# Patient Record
Sex: Female | Born: 2000 | Race: Black or African American | Hispanic: No | Marital: Single | Smoking: Never smoker
Health system: Southern US, Community
[De-identification: ages and names within clinical notes are randomized; demographics above are authoritative.]

---
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
## 2020-01-24 ENCOUNTER — Telehealth: Payer: Self-pay

## 2020-01-24 NOTE — Telephone Encounter (Signed)

## 2020-01-25 ENCOUNTER — Other Ambulatory Visit: Payer: Self-pay

## 2020-01-25 ENCOUNTER — Ambulatory Visit (INDEPENDENT_AMBULATORY_CARE_PROVIDER_SITE_OTHER): Payer: Medicaid Other | Admitting: Pediatrics

## 2020-01-25 VITALS — BP 100/74 | Wt 110.6 lb

## 2020-01-25 DIAGNOSIS — Z130 Encounter for screening for diseases of the blood and blood-forming organs and certain disorders involving the immune mechanism: Secondary | ICD-10-CM

## 2020-01-25 DIAGNOSIS — L700 Acne vulgaris: Secondary | ICD-10-CM

## 2020-01-25 DIAGNOSIS — D508 Other iron deficiency anemias: Secondary | ICD-10-CM | POA: Diagnosis not present

## 2020-01-25 LAB — POCT HEMOGLOBIN: Hemoglobin: 7.2 g/dL — AB (ref 11–14.6)

## 2020-01-25 MED ORDER — RETIN-A 0.025 % EX CREA: TOPICAL_CREAM | CUTANEOUS | 1 refills | Status: DC

## 2020-01-25 NOTE — Patient Instructions (Addendum)
Your labs should be back tomorrow I will call you and set up further care with you.  Continue healthy eating   Try for 5 fruits and vegetables daily  Limit milk or milk products to 1 or 2 servings a day  Eat meats like lean beef, dark meat chicken (think legs and thighs)  6 to 8 glasses of water to drink  Avoid soda  For Acne: Use a cleanser like Dove for Sensitive Skin to wash your face and pat dry. Apply the prescription medicine only at night. Use an SPF 30 cream during the day - Cetaphil makes a good one, Oil of Olay makes a good

## 2020-01-25 NOTE — Progress Notes (Signed)
   Subjective:    Patient ID: Barbara Franklin, female    DOB: 05-22-2001, 19 y.o.   MRN: [id number]  HPI [name] is here to check on her anemia.  She was diagnosed with anemia in 2007 and started on iron.  She states she had stopped her iron supplement and is now having headaches.  Would like her hemoglobin checked.  Eats meats and green vegetables, especially spinach, broccoli, cabbage. Menses are once a month and last about 5 days, with lighter flow towards end of the period.  Having headaches; no dizziness or sense of weakness. Vision is okay. No vomiting or diarrhea. 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 Also states she now has itching. No change in stool habits and no abdominal pain.  2nd concern is acne.  Would like medication for this.  Used benzaclin in the past.  Personal number:  [phone]  Review of Systems As noted in HPI.    Objective:   Physical Exam Vitals and nursing note reviewed.  Constitutional:      General: She is not in acute distress.    Appearance: Normal appearance. She is normal weight.  HENT:     Nose: Nose normal.     Mouth/Throat:     Mouth: Mucous membranes are moist.     Pharynx: Oropharynx is clear.  Cardiovascular:     Rate and Rhythm: Normal rate and regular rhythm.     Pulses: Normal pulses.     Heart sounds: Normal heart sounds. No murmur.  Pulmonary:     Effort: Pulmonary effort is normal. No respiratory distress.     Breath sounds: Normal breath sounds.  Abdominal:     General: Abdomen is flat.     Palpations: Abdomen is soft. There is no mass.     Tenderness: There is no abdominal tenderness.  Musculoskeletal:     Cervical back: Normal range of motion and neck supple.  Skin:    Capillary Refill: Capillary refill takes less than 2 seconds.     Comments: Multiple open and closed comedones at forehead and both cheeks  Neurological:     Mental Status: She is alert.  Psychiatric:        Mood and Affect: Mood normal.        Behavior: Behavior normal.       Assessment & Plan:   1. Screening for iron deficiency anemia   2. Iron deficiency anemia secondary to inadequate dietary iron intake   3. Acne vulgaris   Discussed labs ordered and will follow up with her on the results and plan tomorrow. Discussed acne care - mild cleanser, retinoid, SPF.  Discussed will get better and likely recur so care needs to be continuous based on symptoms.  She voiced understanding. Meds ordered this encounter  Medications  . RETIN-A 0.025 % cream    Sig: Apply to areas of acne at bedtime after gently cleaning face.  Use SPF 30 on face during the day.    Dispense:  45 g    Refill:  1   Maree Erie, MD

## 2020-01-26 ENCOUNTER — Encounter: Payer: Self-pay | Admitting: Pediatrics

## 2020-01-26 LAB — CBC
HCT: 40.8 % (ref 35.0–45.0)
Hemoglobin: 14.1 g/dL (ref 11.7–15.5)
MCH: 29.1 pg (ref 27.0–33.0)
MCHC: 34.6 g/dL (ref 32.0–36.0)
MCV: 84.1 fL (ref 80.0–100.0)
MPV: 8.7 fL (ref 7.5–12.5)
Platelets: 322 10*3/uL (ref 140–400)
RBC: 4.85 10*6/uL (ref 3.80–5.10)
RDW: 12.4 % (ref 11.0–15.0)
WBC: 7 10*3/uL (ref 3.8–10.8)

## 2020-01-26 LAB — RETICULOCYTES
ABS Retic: 48500 cells/uL (ref 20000–8000)
Retic Ct Pct: 1 %

## 2020-01-26 LAB — FERRITIN: Ferritin: 2 ng/mL — ABNORMAL LOW (ref 16–154)

## 2020-01-26 LAB — IRON,?TOTAL/TOTAL IRON BINDING CAP
Iron: 11 ug/dL — ABNORMAL LOW (ref 27–164)
TIBC: 551 mcg/dL (calc) — ABNORMAL HIGH (ref 271–448)

## 2020-01-26 LAB — IRON, TOTAL/TOTAL IRON BINDING CAP: %SAT: 2 % (calc) — ABNORMAL LOW (ref 15–45)

## 2020-03-01 ENCOUNTER — Ambulatory Visit (INDEPENDENT_AMBULATORY_CARE_PROVIDER_SITE_OTHER): Payer: Medicaid Other | Admitting: Pediatrics

## 2020-03-01 ENCOUNTER — Encounter: Payer: Self-pay | Admitting: Pediatrics

## 2020-03-01 ENCOUNTER — Other Ambulatory Visit: Payer: Self-pay

## 2020-03-01 VITALS — BP 110/78 | Wt 108.8 lb

## 2020-03-01 DIAGNOSIS — D508 Other iron deficiency anemias: Secondary | ICD-10-CM

## 2020-03-01 DIAGNOSIS — Z7189 Other specified counseling: Secondary | ICD-10-CM | POA: Diagnosis not present

## 2020-03-01 DIAGNOSIS — L700 Acne vulgaris: Secondary | ICD-10-CM

## 2020-03-01 DIAGNOSIS — Z7187 Encounter for pediatric-to-adult transition counseling: Secondary | ICD-10-CM

## 2020-03-01 LAB — POCT HEMOGLOBIN: Hemoglobin: 10.2 g/dL — AB (ref 11–14.6)

## 2020-03-01 NOTE — Patient Instructions (Signed)
Please do the stool slides on Sunday or Monday and bring to the office on Monday.  I will let you know about your blood work on Monday.  Continue to clean your face with [facility] and use the Retin-A at bedtime; SPF moisturizer during the day. You will get a call from the dermatology office about setting an appointment

## 2020-03-01 NOTE — Progress Notes (Signed)
   Subjective:    Patient ID: Barbara Franklin, female    DOB: 2001/10/15, 19 y.o.   MRN: [id number]  HPI Barbara Franklin is here for follow up on anemia and acne.  She states she is doing well and has been taking her iron tablets at bedtime.  1.  Anemia:   States since she started back on the iron she sometimes has nausea when she eats and feels like she is going to throw up. States stools are dark on the iron. Neither was a problem before taking the medication.  2.  Acne: States she is using her acne medication but notices whenever one pimple goes away, another one pops up. Using [employer] and SPF.  Works [employer] Fri 2 to 11 pm Personal phone:  [phone]  PMH, problem list, medications and allergies, family and social history reviewed and updated as indicated.  Review of Systems  Constitutional: Negative for activity change, appetite change, fatigue and fever.  Cardiovascular: Negative for chest pain.  Gastrointestinal: Positive for nausea. Negative for abdominal pain and anal bleeding.  Genitourinary: Negative for menstrual problem.       Objective:   Physical Exam Vitals and nursing note reviewed.  Constitutional:      General: She is not in acute distress.    Appearance: She is normal weight.  Cardiovascular:     Rate and Rhythm: Normal rate and regular rhythm.     Pulses: Normal pulses.     Heart sounds: No murmur.  Pulmonary:     Effort: Pulmonary effort is normal. No respiratory distress.     Breath sounds: Normal breath sounds.  Skin:    General: Skin is warm and dry.     Comments: Open and closed comedones present and hyperpigmented scars  Neurological:     Mental Status: She is alert.       Assessment & Plan:  1. Iron deficiency anemia secondary to inadequate dietary iron intake Advised that POC hemoglobin is improved; will check venipuncture due to wide disagreement in values at last visit. Dark stools and nausea may be due to the supplement or occult blood  loss. Advised continuing iron tablets for now.  Healthy diet. Hemoccult cards given and she is asked to bring those back on Monday 5/17 - POCT hemoglobin - Iron,Total/Total Iron Binding Cap - Ferritin - Reticulocytes - CBC  2. Acne vulgaris Advised that her experience is typical and she should continue with her current regimen. Referral placed to Dermatology due to marked hyperpigmentation and scarring associated with her acne. - Ambulatory referral to Dermatology  3. Counseling for transition from pediatric to adult care provider Discussed transition to adult care.  Barbara Franklin presents with awareness of her health needs, medications and insurance. She is due for an annual visit. She is agreeable to transfer to [facility]. - Ambulatory referral to [facility]  Maree Erie, MD

## 2020-03-02 LAB — RETICULOCYTES
ABS Retic: 56980 cells/uL (ref 20000–8000)
Retic Ct Pct: 1.1 %

## 2020-03-02 LAB — CBC
HCT: 39.6 % (ref 35.0–45.0)
Hemoglobin: 11.8 g/dL (ref 11.7–15.5)
MCH: 22.8 pg — ABNORMAL LOW (ref 27.0–33.0)
MCHC: 29.8 g/dL — ABNORMAL LOW (ref 32.0–36.0)
MCV: 76.4 fL — ABNORMAL LOW (ref 80.0–100.0)
MPV: 9.6 fL (ref 7.5–12.5)
Platelets: 534 10*3/uL — ABNORMAL HIGH (ref 140–400)
RBC: 5.18 10*6/uL — ABNORMAL HIGH (ref 3.80–5.10)
WBC: 5.7 10*3/uL (ref 3.8–10.8)

## 2020-03-02 LAB — FERRITIN: Ferritin: 22 ng/mL (ref 16–154)

## 2020-03-02 LAB — IRON, TOTAL/TOTAL IRON BINDING CAP
%SAT: 25 % (calc) (ref 15–45)
Iron: 118 ug/dL (ref 27–164)
TIBC: 471 mcg/dL (calc) — ABNORMAL HIGH (ref 271–448)

## 2020-03-11 NOTE — Progress Notes (Signed)
MyChart message viewed by patient on 03/08/2020. Comments to Patient  "Seen by patient [name] on 03/08/2020 5:54 PM EDT   Hello. Barbara Franklin. The iron treatment by mouth is working well but you need to continue to take it for at least one more month. Please call the office to schedule a repeat blood test in late June. Also, don't forget to bring the stool cards. Written by Maree Erie, MD on 03/08/2020 5:54 PM EDT"

## 2020-04-29 ENCOUNTER — Encounter: Payer: Self-pay | Admitting: Pediatrics

## 2020-04-29 ENCOUNTER — Other Ambulatory Visit: Payer: Self-pay

## 2020-04-29 ENCOUNTER — Ambulatory Visit (INDEPENDENT_AMBULATORY_CARE_PROVIDER_SITE_OTHER): Payer: Medicaid Other | Admitting: Pediatrics

## 2020-04-29 DIAGNOSIS — Z7187 Encounter for pediatric-to-adult transition counseling: Secondary | ICD-10-CM

## 2020-04-29 DIAGNOSIS — Z23 Encounter for immunization: Secondary | ICD-10-CM

## 2020-04-29 DIAGNOSIS — L700 Acne vulgaris: Secondary | ICD-10-CM | POA: Diagnosis not present

## 2020-04-29 DIAGNOSIS — Z7189 Other specified counseling: Secondary | ICD-10-CM

## 2020-04-29 DIAGNOSIS — D508 Other iron deficiency anemias: Secondary | ICD-10-CM

## 2020-04-29 LAB — POCT HEMOGLOBIN: Hemoglobin: 12.4 g/dL (ref 11–14.6)

## 2020-04-29 MED ORDER — RETIN-A 0.025 % EX CREA: TOPICAL_CREAM | CUTANEOUS | 1 refills | Status: DC

## 2020-04-29 MED ORDER — ONE-A-DAY WOMENS PETITES PO TABS: ORAL_TABLET | ORAL | 5 refills | Status: DC

## 2020-04-29 NOTE — Progress Notes (Signed)
   Subjective:    Patient ID: Barbara Franklin, female    DOB: July 10, 2001, 19 y.o.   MRN: [id number]  HPI Barbara Franklin is here for follow up on anemia after treatment with supplemental iron. She states compliance and reports she is feeling much better. Asks if she needs to stay on the iron supplement.  Also asks for refill of her acne medication.  She is currently working at [employer]; has not taken COVID vaccine.  PMH, problem list, medications and allergies, family and social history reviewed and updated as indicated.  Review of Systems As noted in HPI above.    Objective:   Physical Exam Vitals and nursing note reviewed.  Constitutional:      General: She is not in acute distress.    Appearance: Normal appearance. She is normal weight.  HENT:     Head: Normocephalic.  Eyes:     General: No scleral icterus. Cardiovascular:     Rate and Rhythm: Normal rate and regular rhythm.     Pulses: Normal pulses.     Heart sounds: Normal heart sounds. No murmur heard.   Pulmonary:     Effort: Pulmonary effort is normal. No respiratory distress.     Breath sounds: Normal breath sounds.  Skin:    General: Skin is warm and dry.     Comments: Few scattered open and closed comedones at forehead and cheeks  Neurological:     Mental Status: She is alert.    Results for orders placed or performed in visit on 04/29/20 (from the past 72 hour(s))  POCT hemoglobin     Status: Normal   Collection Time: 04/29/20  3:56 PM  Result Value Ref Range   Hemoglobin 12.4 11 - 14.6 g/dL      Assessment & Plan:   1. Iron deficiency anemia secondary to inadequate dietary iron intake Informed Barbara Franklin that value is good today.  Advised her to stop the iron sulfate tablets and start a daily multivitamin with iron.   Recommended petites due to her report of trouble swallowing standard tablet. - POCT hemoglobin - Multiple Vitamins-Minerals (ONE-A-DAY WOMENS PETITES) TABS; Take 2 tablets by mouth once a day  every day as a nutritional supplement  Dispense: 160 tablet; Refill: 5  2. Acne vulgaris Skin is improved and she has a healthy glow today.  Advised continued use of mild cleanser and SPF during the day. Refill entered. - RETIN-A 0.025 % cream; Apply to areas of acne at bedtime after gently cleaning face.  Use SPF 30 on face during the day.  Dispense: 45 g; Refill: 1  3. Counseling for transition from pediatric to adult care provider Counseled on transition to adult care.  She shows knowledge of her health needs, her insurance and medications. She agreed to referral to [facility].  Referral placed and advised patient to call for complete physical. - Ambulatory referral to [facility]  4. Need for vaccination Counseled Barbara Franklin on COVID vaccine including vaccine type available here, number of doses, desired effect and potential SE. She voiced understanding but declined vaccine for today, stating desire to speak with her parents.  Printed information provided for further education.  Number given to call and schedule vaccine if she decides to move forth.  Barbara Erie, MD

## 2020-04-29 NOTE — Patient Instructions (Addendum)
You have been referred to [facility] for continued health care as a young adult. You can call them to schedule a wellness physical and to get established in care.  They will accept your insurance.  Address: [address], [place], [place] [address] Phone: [phone]  I have sent your prescriptions to the pharmacy.  If your insurance does not cover the vitamins, I have asked the pharmacist to show you over the counter options.  Please consider the COVID vaccine for your wellness.  You can call and schedule:  [phone]

## 2020-08-20 ENCOUNTER — Other Ambulatory Visit: Payer: Self-pay

## 2020-08-20 ENCOUNTER — Ambulatory Visit (INDEPENDENT_AMBULATORY_CARE_PROVIDER_SITE_OTHER): Payer: Medicaid Other | Admitting: Family Medicine

## 2020-08-20 ENCOUNTER — Encounter: Payer: Self-pay | Admitting: Family Medicine

## 2020-08-20 VITALS — BP 108/72 | HR 80 | Ht 60.25 in | Wt 111.8 lb

## 2020-08-20 DIAGNOSIS — L2082 Flexural eczema: Secondary | ICD-10-CM

## 2020-08-20 DIAGNOSIS — Z23 Encounter for immunization: Secondary | ICD-10-CM | POA: Diagnosis not present

## 2020-08-20 DIAGNOSIS — R21 Rash and other nonspecific skin eruption: Secondary | ICD-10-CM

## 2020-08-20 DIAGNOSIS — D508 Other iron deficiency anemias: Secondary | ICD-10-CM

## 2020-08-20 DIAGNOSIS — L700 Acne vulgaris: Secondary | ICD-10-CM | POA: Diagnosis not present

## 2020-08-20 MED ORDER — TRIAMCINOLONE ACETONIDE 0.1 % EX CREA: 1.0000 "application " | TOPICAL_CREAM | Freq: Two times a day (BID) | CUTANEOUS | 0 refills | Status: DC

## 2020-08-20 MED ORDER — RETIN-A 0.025 % EX CREA: TOPICAL_CREAM | CUTANEOUS | 1 refills | Status: DC

## 2020-08-20 MED ORDER — FLINTSTONES COMPLETE 18 MG PO CHEW: 1.0000 | CHEWABLE_TABLET | Freq: Every day | ORAL | Status: DC

## 2020-08-20 NOTE — Progress Notes (Cosign Needed)
° ° °  SUBJECTIVE:   CHIEF COMPLAINT / HPI:   Establish care/meet new PCP Appointment today to establish care and meet new PCP. Two ongoing problems as discussed below.  Overall fairly healthy with minimal medications.  Itchy skin patches Reports itchy skin patches that abrupt on her upper body, mostly her arms and elbows along with neck and chest.  Her last doctor told her to use Vaseline, but she finds it too thick.  Iron deficiency anemia Diagnosed 03/01/2020 with hemoglobin of 10.2 and resultant iron studies.  Has been on iron supplementation since that time.  Follow-up hemoglobin 12.4 on 04/29/2020.  Today she is wondering if she needs to continue iron supplementation.  Also is not taking her multivitamin every day because the pills large and gets stuck in the back of her throat.  Social Determinant of Health (SDOH) Summary - Lives with mother, father, and six siblings (of which she's the oldest). Family immigrated from [place] as refugees in 2014. She [place]. Youngest brother Answer born in [place].  - Graduated high school May 2021. Now part time student at [employer], unsure of what exactly she wants to study after her basic courses. Taking 3 courses this semester (fall 2021).   - Currently works part time at [employer] here in [place]. Works 8 hour shifts 3-4 days/week. Reports no heavy lifting, twisting, or strenuous physical activity associated with work.  - Mom works at [employer].  - Currently no concerns about groceries, transportation, costs of medicines, etc.      PERTINENT  PMH / PSH: Iron deficiency anemia  OBJECTIVE:   BP 108/72    Pulse 80    Ht 5' 0.25" (1.53 m)    Wt 111 lb 12.8 oz (50.7 kg)    LMP 08/20/2020    SpO2 99%    BMI 21.65 kg/m     PHQ-9:  Depression screen [facility] 2/9 08/20/2020  Decreased Interest 0  Down, Depressed, Hopeless 0  PHQ - 2 Score 0  Altered sleeping 0  Tired, decreased  energy 0  Change in appetite 0  Feeling bad or failure about yourself  0  Trouble concentrating 0  Moving slowly or fidgety/restless 0  Suicidal thoughts 0  PHQ-9 Score 0  Difficult doing work/chores Not difficult at all     GAD-7: No flowsheet data found.    Physical Exam General: Awake, alert, oriented Cardiovascular: Regular rate and rhythm, S1 and S2 present, no murmurs auscultated Respiratory: Lung fields clear to auscultation bilaterally Integument: Raised patches with flaking dry skin on flexor surface of right elbow and right neck above clavicle Extremities: No bilateral lower extremity edema, palpable pedal and pretibial pulses bilaterally Neuro: Cranial nerves II through X grossly intact, able to move all extremities spontaneously   ASSESSMENT/PLAN:   Iron deficiency anemia Diagnosed April 2021 with Hgb 7.4. After daily iron supplementation, last Hgb July 2021 was 12.4. Doesn't take women's multivitamin due to pill size and "getting stuck in [her] throat". Will recommend Flintstones Complete Chewable multivitamin with iron.   Rash and nonspecific skin eruption Appears to be eczema. Patient previously found vaseline to be too thick on skin. Prescribed triamcinalone cream prn for associated pruritis. Also recommended daily lotion application with Cereve, Vaseline lotion, or Aquaphor.      Fayette Pho, MD [facility]

## 2020-08-20 NOTE — Assessment & Plan Note (Addendum)
Diagnosed April 2021 with Hgb 7.4. After daily iron supplementation, last Hgb July 2021 was 12.4. Doesn't take women's multivitamin due to pill size and "getting stuck in [her] throat". Will recommend Flintstones Complete Chewable multivitamin with iron.

## 2020-08-20 NOTE — Patient Instructions (Signed)
It was wonderful to meet you today. Thank you for allowing me to be a part of your care. Below is a short summary of what we discussed at your visit today:  Itchy skin patches The itchy patches on your arm and neck looks like ezcema. Do NOT use the retinol cream for your face on any of these itchy skin patches.  I am prescribing a steroid cream you can use to help with the itching.  Also, for preventative care use a moisturizing lotion every day, and also right if you get out of the shower.  Shower with cooler water to avoid irritation.  Examples of lotions you can use are below:  - Cereve - Vaseline lotion (in yellow or green bottle) - Aquaphor (usually on the thicker side)  Iron deficiency anemia Since your hemoglobin has come back up with iron supplementation and you are not a fan of taking a big multivitamin pill, I recommend switching to a chewable multivitamin with iron called "Flintstones Complete with iron".  It is chewable so it should not get stuck in your throat.  It has iron which will help with your iron deficiency.   If you have any questions or concerns, please do not hesitate to contact us via phone or MyChart message.   Fayette Pho, MD

## 2020-08-20 NOTE — Assessment & Plan Note (Signed)
Appears to be eczema. Patient previously found vaseline to be too thick on skin. Prescribed triamcinalone cream prn for associated pruritis. Also recommended daily lotion application with Cereve, Vaseline lotion, or Aquaphor.

## 2022-03-24 ENCOUNTER — Encounter: Payer: Self-pay | Admitting: *Deleted

## 2022-11-06 ENCOUNTER — Telehealth: Payer: Self-pay | Admitting: Family Medicine

## 2022-11-06 DIAGNOSIS — Z0279 Encounter for issue of other medical certificate: Secondary | ICD-10-CM | POA: Diagnosis not present

## 2022-11-06 NOTE — Telephone Encounter (Signed)
patient dropped off form at front desk for Staff Health Assessment/Medical Report.  Verified that patient section of form has been completed.  Last DOS/WCC with PCP was 08/20/20.  Placed form in green team folder to be completed by clinical staff.  Creig Hines

## 2022-11-12 ENCOUNTER — Encounter: Payer: Self-pay | Admitting: Family Medicine

## 2022-11-12 ENCOUNTER — Ambulatory Visit (INDEPENDENT_AMBULATORY_CARE_PROVIDER_SITE_OTHER): Payer: Medicaid Other | Admitting: Family Medicine

## 2022-11-12 VITALS — BP 121/75 | HR 117 | Ht 60.0 in | Wt 109.8 lb

## 2022-11-12 DIAGNOSIS — Z0000 Encounter for general adult medical examination without abnormal findings: Secondary | ICD-10-CM | POA: Diagnosis not present

## 2022-11-12 DIAGNOSIS — Z23 Encounter for immunization: Secondary | ICD-10-CM

## 2022-11-12 DIAGNOSIS — Z1159 Encounter for screening for other viral diseases: Secondary | ICD-10-CM | POA: Diagnosis not present

## 2022-11-12 DIAGNOSIS — Z124 Encounter for screening for malignant neoplasm of cervix: Secondary | ICD-10-CM

## 2022-11-12 DIAGNOSIS — L2082 Flexural eczema: Secondary | ICD-10-CM | POA: Diagnosis not present

## 2022-11-12 DIAGNOSIS — D508 Other iron deficiency anemias: Secondary | ICD-10-CM | POA: Diagnosis not present

## 2022-11-12 DIAGNOSIS — L700 Acne vulgaris: Secondary | ICD-10-CM | POA: Diagnosis not present

## 2022-11-12 DIAGNOSIS — R5383 Other fatigue: Secondary | ICD-10-CM

## 2022-11-12 DIAGNOSIS — Z113 Encounter for screening for infections with a predominantly sexual mode of transmission: Secondary | ICD-10-CM | POA: Diagnosis not present

## 2022-11-12 MED ORDER — RETIN-A 0.025 % EX CREA: TOPICAL_CREAM | CUTANEOUS | 1 refills | Status: DC

## 2022-11-12 MED ORDER — TRIAMCINOLONE ACETONIDE 0.1 % EX CREA: 1.0000 | TOPICAL_CREAM | Freq: Two times a day (BID) | CUTANEOUS | 0 refills | Status: DC

## 2022-11-12 NOTE — Telephone Encounter (Signed)
Pt had appt with Dr. Jeani Hawking today. Form filled out during this visit. Ottis Stain, CMA

## 2022-11-12 NOTE — Progress Notes (Signed)
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  She reports she feels well and only has a couple of concerns, those are discussed below.  Medications reviewed, she is only taking Benadryl daily for itching and sneezing.  No longer taking her multivitamin with iron, probably for the last 2 months.  She is amenable to a flu vaccine today.  Itching and sneezing She reports taking Benadryl daily to help with the symptoms.  The symptoms do resolve with Benadryl, but returned the next day once the medication wears off.  She is wondering if there is anything different she can take on external workup we can do.  Tongue concern Patient has noticed in the last couple of months large raised circular spots across her tongue.  She was wondering if the daily multivitamin was doing this to her or not.  These tongue spots do not bother her in any way.  They are not painful or uncomfortable.  She has not noticed any associated skin changes, plaque, discharge, or erythema.  She is able to eat without discomfort.  No throat swelling or difficulty with airway.  PERTINENT  PMH / PSH:  Patient Active Problem List   Diagnosis Date Noted   Cervical cancer screening 11/13/2022   Healthcare maintenance 11/13/2022   Flexural eczema 11/13/2022   Acne vulgaris 11/13/2022   Iron deficiency anemia 11/11/2016    OBJECTIVE:   BP 121/75   Pulse (!) 117   Ht 5' (1.524 m)   Wt 109 lb 12.8 oz (49.8 kg)   LMP 10/29/2022 (Exact Date)   SpO2 98%   BMI 21.44 kg/m    PHQ-9:     11/12/2022   10:29 AM 08/20/2020   10:24 AM  Depression screen PHQ 2/9  Decreased Interest 0 0  Down, Depressed, Hopeless 0 0  PHQ - 2 Score 0 0  Altered sleeping 1 0  Tired, decreased energy 0 0  Change in appetite 0 0  Feeling bad or failure about yourself  0 0  Trouble concentrating 0 0  Moving slowly or fidgety/restless 0 0  Suicidal  thoughts 0 0  PHQ-9 Score 1 0  Difficult doing work/chores Not difficult at all Not difficult at all   Physical Exam General: Awake, alert, oriented HEENT: PERRL, bilateral external auditory canals occluded with cerumen bilaterally, nasal mucosa boggy and pale, oral mucosa pink, moist, without lesion, intact dentition without obvious cavity, tongue with scattered skin colored slightly raised circular lesions Lymph: No palpable lymphedema of head or neck, thyroid unremarkable to palpation Cardiovascular: Regular rate and rhythm, S1 and S2 present, no murmurs auscultated Respiratory: Lung fields clear to auscultation bilaterally  ASSESSMENT/PLAN:   Iron deficiency anemia Not taking any iron supplementation.  Patient with more fatigue, concerning for worsening anemia.  Will obtain CBC and iron levels today.  Acne vulgaris Refilled her retinol cream per patient request.  Tolerating well no side effects.  Flexural eczema Refill triamcinolone cream per patient request.  Tolerating well.  No side effects.  Cervical cancer screening Declines Pap at this time.  I am okay with this that she is abdomen sexually active.  Discussed that we should start screening after sexual debut.  Patient in agreement.  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  Obtain screening labs today  including hep C screening, BMP, CBC, iron, A1c, and TSH.  Will follow results.  Flu shot today, tolerated well.  No adverse side effects.     Ezequiel Essex, MD [place]

## 2022-11-12 NOTE — Patient Instructions (Addendum)
It was wonderful to see you today. Thank you for allowing me to be a part of your care. Below is a short summary of what we discussed at your visit today:  Physical Your physical exam today was good! Everything looks and sounds normal.   Itchiness, runny nose Start using a second generation antihistamine, which will control the itching and runny nose without making you sleepy. Examples are zyrtec, allegra, claritin, xyzal  Lab work Today we obtained some blood work to check in on how you are doing.  We got blood work to evaluate your anemia, iron level, kidney function, blood electrolytes, blood sugar, and thyroid. If the results are normal, I will send you a letter or MyChart message. If the results are abnormal, I will give you a call.    PAP smear Because you are older than 21 years, we should start routine Pap smears to screen for cervical cancer.  Please book this on your way out at the front desk.  This may be anytime at your convenience.  There is no rush.  Also, it is okay if you want to wait to start this test until you are sexually active. This is your choice.   Please bring all of your medications to every appointment!  If you have any questions or concerns, please do not hesitate to contact us via phone or MyChart message.   Ezequiel Essex, MD

## 2022-11-13 ENCOUNTER — Telehealth: Payer: Self-pay | Admitting: Family Medicine

## 2022-11-13 ENCOUNTER — Encounter: Payer: Self-pay | Admitting: Family Medicine

## 2022-11-13 DIAGNOSIS — Z0000 Encounter for general adult medical examination without abnormal findings: Secondary | ICD-10-CM | POA: Insufficient documentation

## 2022-11-13 DIAGNOSIS — Z124 Encounter for screening for malignant neoplasm of cervix: Secondary | ICD-10-CM | POA: Insufficient documentation

## 2022-11-13 DIAGNOSIS — L2082 Flexural eczema: Secondary | ICD-10-CM | POA: Insufficient documentation

## 2022-11-13 DIAGNOSIS — L700 Acne vulgaris: Secondary | ICD-10-CM | POA: Insufficient documentation

## 2022-11-13 LAB — CBC
Hematocrit: 24.7 % — ABNORMAL LOW (ref 34.0–46.6)
Hemoglobin: 6.5 g/dL — CL (ref 11.1–15.9)
MCH: 16.5 pg — ABNORMAL LOW (ref 26.6–33.0)
MCHC: 26.3 g/dL — ABNORMAL LOW (ref 31.5–35.7)
MCV: 63 fL — ABNORMAL LOW (ref 79–97)
Platelets: 406 10*3/uL (ref 150–450)
RBC: 3.94 x10E6/uL (ref 3.77–5.28)
RDW: 17.6 % — ABNORMAL HIGH (ref 11.7–15.4)
WBC: 5 10*3/uL (ref 3.4–10.8)

## 2022-11-13 LAB — TSH: TSH: 1.23 u[IU]/mL (ref 0.450–4.500)

## 2022-11-13 LAB — IRON AND TIBC
Iron Saturation: 3 % — CL (ref 15–55)
Iron: 14 ug/dL — ABNORMAL LOW (ref 27–159)
Total Iron Binding Capacity: 543 ug/dL — ABNORMAL HIGH (ref 250–450)
UIBC: 529 ug/dL — ABNORMAL HIGH (ref 131–425)

## 2022-11-13 LAB — BASIC METABOLIC PANEL
BUN/Creatinine Ratio: 14 (ref 9–23)
BUN: 8 mg/dL (ref 6–20)
CO2: 18 mmol/L — ABNORMAL LOW (ref 20–29)
Calcium: 9.6 mg/dL (ref 8.7–10.2)
Chloride: 104 mmol/L (ref 96–106)
Creatinine, Ser: 0.56 mg/dL — ABNORMAL LOW (ref 0.57–1.00)
Glucose: 78 mg/dL (ref 70–99)
Potassium: 4 mmol/L (ref 3.5–5.2)
Sodium: 140 mmol/L (ref 134–144)
eGFR: 132 mL/min/{1.73_m2} (ref >59)

## 2022-11-13 LAB — HEMOGLOBIN A1C
Est. average glucose Bld gHb Est-mCnc: 111 mg/dL
Hgb A1c MFr Bld: 5.5 % (ref 4.8–5.6)

## 2022-11-13 LAB — HEPATITIS C ANTIBODY: Hep C Virus Ab: NONREACTIVE

## 2022-11-13 NOTE — Assessment & Plan Note (Signed)
Not taking any iron supplementation.  Patient with more fatigue, concerning for worsening anemia.  Will obtain CBC and iron levels today.

## 2022-11-13 NOTE — Assessment & Plan Note (Signed)
Declines Pap at this time.  I am okay with this that she is abdomen sexually active.  Discussed that we should start screening after sexual debut.  Patient in agreement.

## 2022-11-13 NOTE — Assessment & Plan Note (Signed)
Refilled her retinol cream per patient request.  Tolerating well no side effects.

## 2022-11-13 NOTE — Telephone Encounter (Signed)
Called patient regarding labs. No answer, left detailed but HIPAA compliant VM.  We found the reason she feels so tired - she is severely anemic. Hgb 6.5 with evidence of iron deficiency. IDA is a known diagnosis for her, but she has stopped taking her iron continue multivitamin a couple months ago for her.  I want her to get an iron transfusion as soon as we can get this scheduled.  I am having trouble ordering the iron transfusion as normal (Epic error messaging) but I will get this sorted asap.  Once we hear back from her regarding availabilities, I will call the [facility] infusion clinic to get this scheduled.  Ezequiel Essex, MD

## 2022-11-13 NOTE — Assessment & Plan Note (Addendum)
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  Obtain screening labs today including hep C screening, BMP, CBC, iron, A1c, and TSH.  Will follow results.  Flu shot today, tolerated well.  No adverse side effects.

## 2022-11-13 NOTE — Assessment & Plan Note (Signed)
Refill triamcinolone cream per patient request.  Tolerating well.  No side effects.

## 2022-11-16 ENCOUNTER — Telehealth: Payer: Self-pay | Admitting: Family Medicine

## 2022-11-16 ENCOUNTER — Other Ambulatory Visit: Payer: Self-pay | Admitting: Family Medicine

## 2022-11-16 DIAGNOSIS — D508 Other iron deficiency anemias: Secondary | ICD-10-CM

## 2022-11-16 NOTE — Telephone Encounter (Signed)
Called patient regarding labs. Patient answered, positively identified herself.  Discussed iron infusion, patient amenable. Completed admission orders for Feraheme IVPB infusion 510 mg once.   [facility] at [phone]. Left message with patient details for scheduling. For scheduling, patient prefers Monday mornings or Fridays between 10 AM and 1 PM.  [facility] RN pool: If infusion clinic calls back, please take appointment details and relay to patient.   Thank you,   Ezequiel Essex, MD

## 2022-11-16 NOTE — Progress Notes (Signed)
Fereheme orders for infusion clinic. RN will release.    ferumoxytol (FERAHEME) 510 mg in sodium chloride 0.9 % 100 mL IVPB- Once   Ezequiel Essex, MD

## 2022-11-17 NOTE — Telephone Encounter (Signed)
Received returned call from [place]. Scheduled patient for 11/23/22 at 12:00 pm.   They needed attending name for scheduling appointment, provided with preceptor for yesterday, Dr. Gwendlyn Deutscher.   Called patient and discussed. Mychart message with appointment information sent.   Talbot Grumbling, RN

## 2022-11-23 ENCOUNTER — Ambulatory Visit (HOSPITAL_COMMUNITY)
Admission: RE | Admit: 2022-11-23 | Discharge: 2022-11-23 | Disposition: A | Discharge status: Home / Self Care | Payer: Medicaid Other | Source: Ambulatory Visit | Attending: Family Medicine | Admitting: Family Medicine

## 2022-11-23 DIAGNOSIS — D508 Other iron deficiency anemias: Secondary | ICD-10-CM | POA: Insufficient documentation

## 2022-11-23 MED ORDER — SODIUM CHLORIDE 0.9 % IV SOLN
510.0000 mg | Freq: Once | INTRAVENOUS | Status: AC
  Administered 2022-11-23: 510 mg via INTRAVENOUS
  Filled 2022-11-23: qty 510

## 2023-10-04 ENCOUNTER — Ambulatory Visit: Payer: Medicaid Other

## 2023-10-06 ENCOUNTER — Ambulatory Visit: Payer: Medicaid Other

## 2023-10-06 VITALS — BP 123/87 | HR 105 | Ht 60.0 in | Wt 115.4 lb

## 2023-10-06 DIAGNOSIS — R21 Rash and other nonspecific skin eruption: Secondary | ICD-10-CM

## 2023-10-06 DIAGNOSIS — L2082 Flexural eczema: Secondary | ICD-10-CM

## 2023-10-06 DIAGNOSIS — D508 Other iron deficiency anemias: Secondary | ICD-10-CM | POA: Diagnosis not present

## 2023-10-06 MED ORDER — FERROUS SULFATE 325 (65 FE) MG PO TABS: 325.0000 mg | ORAL_TABLET | ORAL | 3 refills | Status: DC

## 2023-10-06 MED ORDER — TRIAMCINOLONE ACETONIDE 0.1 % EX CREA: 1.0000 | TOPICAL_CREAM | Freq: Two times a day (BID) | CUTANEOUS | 0 refills | Status: DC

## 2023-10-06 MED ORDER — CETIRIZINE HCL 10 MG PO TABS: 10.0000 mg | ORAL_TABLET | Freq: Every day | ORAL | 0 refills | Status: DC

## 2023-10-06 NOTE — Assessment & Plan Note (Addendum)
History of low iron requiring iron infusion.   -Start ferrous sulfate every other day -Check CBC and iron panel, future

## 2023-10-06 NOTE — Progress Notes (Signed)
    SUBJECTIVE:   CHIEF COMPLAINT / HPI: Rash  Presents with a week-long history of a pruritic rash on the upper part of her body, including her arms, neck, and back. She has been self-treating with oral and topical Benadryl, which provides some relief from the itching. She denies any recent changes in medications, detergents, or soaps, and she has not experienced any fevers or other systemic symptoms. She uses the same sensitive Dove soap and Vaseline for her skin care routine.  In addition to her skin concerns, she reports a history of low iron levels. Denies any heavy menses. She had an iron infusion at another hospital but has not followed up on her iron levels since then. She has previously taken oral iron supplements but has not done so recently. She denies any gastrointestinal upset from the iron supplements. Denies any lightheadedness or shortness of breath.  PERTINENT  PMH / PSH: reviewed  OBJECTIVE:   BP 123/87   Pulse (!) 105   Ht 5' (1.524 m)   Wt 115 lb 6.4 oz (52.3 kg)   LMP 09/24/2023   SpO2 100%   BMI 22.54 kg/m   General: Well appearing, NAD, awake, alert, responsive to questions Head: Normocephalic atraumatic Respiratory: chest rises symmetrically,  no increased work of breathing Skin: Excoriations on arms, post inflammatory hyperpigmentation,no erythema or warmth   ASSESSMENT/PLAN:   Assessment & Plan Rash Does have a history of eczema and this may be related to this. -Triamcinolone twice daily -Switch Benadryl to Zyrtec daily -If not improved in 2 weeks patient instructed to return to care Iron deficiency anemia secondary to inadequate dietary iron intake History of low iron requiring iron infusion.   -Start ferrous sulfate every other day -Check CBC and iron panel, future   Levin Erp, MD [facility]

## 2023-10-06 NOTE — Patient Instructions (Signed)
It was great to see you! Thank you for allowing me to participate in your care!   Our plans for today:  -I am sending in triamcinolone for you to take twice a day on these areas and also sending in Zyrtec to help with itching if not improving in 2 weeks that recommend coming back -I also recommend starting the iron pill I am sending in taking it every other day-I have placed a order for checking your iron as well as a future order-please schedule a lab appointment at the front   Take care and seek immediate care sooner if you develop any concerns.  Levin Erp, MD

## 2023-11-12 ENCOUNTER — Ambulatory Visit: Payer: Medicaid Other | Admitting: Student

## 2023-11-12 ENCOUNTER — Encounter: Payer: Self-pay | Admitting: Student

## 2023-11-12 VITALS — BP 120/65 | HR 65 | Ht 60.0 in | Wt 116.4 lb

## 2023-11-12 DIAGNOSIS — R21 Rash and other nonspecific skin eruption: Secondary | ICD-10-CM | POA: Diagnosis not present

## 2023-11-12 DIAGNOSIS — L2082 Flexural eczema: Secondary | ICD-10-CM | POA: Diagnosis not present

## 2023-11-12 DIAGNOSIS — D508 Other iron deficiency anemias: Secondary | ICD-10-CM

## 2023-11-12 DIAGNOSIS — L299 Pruritus, unspecified: Secondary | ICD-10-CM | POA: Diagnosis not present

## 2023-11-12 MED ORDER — CETIRIZINE HCL 10 MG PO TABS: 10.0000 mg | ORAL_TABLET | Freq: Every day | ORAL | 0 refills | Status: DC

## 2023-11-12 NOTE — Patient Instructions (Signed)
Pleasure to meet you.  Glad to hear that your rash has since resolved.  I have refilled your Zyrtec's for your generalized itchiness.  Today we collected lab to check your blood count, kidney/liver function and your thyroid level as well to evaluate for your itchiness.

## 2023-11-12 NOTE — Assessment & Plan Note (Addendum)
Reolved with triamcinolone cream and Vaseline.

## 2023-11-12 NOTE — Progress Notes (Signed)
    SUBJECTIVE:   CHIEF COMPLAINT / HPI:   23 year old female with history of eczema Presenting today for continued rash Was recently seen about few weeks ago for pruritic rash Sent home on triamcinolone and Zyrtec's. She has also tried Benadryl. Today patient reports the rash has since resolved however she has continued to have generalized pruritus especially if she does not take her Zyrtec. Denies pruritus have been present since 2020 and she does have known history of anemia.   Previously been taking Benadryl for the itchiness but was recently switched to Zyrtec which has been working. Neuritis not associated with warm bath.  PERTINENT  PMH / PSH: Eczema  OBJECTIVE:   BP 120/65   Pulse 65   Ht 5' (1.524 m)   Wt 116 lb 6.4 oz (52.8 kg)   LMP 10/25/2023   SpO2 100%   BMI 22.73 kg/m     Physical Exam General: Alert, well appearing, NAD Cardiovascular: RRR, No Murmurs, Normal S2/S2 Respiratory: CTAB, No wheezing or Rales Skin: No visible lesions or rash on skin.  ASSESSMENT/PLAN:   Flexural eczema Reolved with triamcinolone cream and Vaseline.  Generalized pruritus This appears to be chronic for patient.  Unknown cause of her generalized pruritus with no skin eruption.  Other differential includes liver/kidney etiology, thyroid disorder, or blood disorder such as eosinophilia.  Obtain lab to further evaluate. -Order lab for CBC with differential, TSH, CMP -Follow-up with ferritin level   Jerre Simon, MD [facility]

## 2023-11-13 LAB — CBC WITH DIFFERENTIAL/PLATELET
Basophils Absolute: 0.1 10*3/uL (ref 0.0–0.2)
Basos: 1 %
EOS (ABSOLUTE): 0.2 10*3/uL (ref 0.0–0.4)
Eos: 4 %
Hematocrit: 38.3 % (ref 34.0–46.6)
Hemoglobin: 11.6 g/dL (ref 11.1–15.9)
Immature Grans (Abs): 0 10*3/uL (ref 0.0–0.1)
Immature Granulocytes: 0 %
Lymphocytes Absolute: 2.4 10*3/uL (ref 0.7–3.1)
Lymphs: 46 %
MCH: 23.1 pg — ABNORMAL LOW (ref 26.6–33.0)
MCHC: 30.3 g/dL — ABNORMAL LOW (ref 31.5–35.7)
MCV: 76 fL — ABNORMAL LOW (ref 79–97)
Monocytes Absolute: 0.4 10*3/uL (ref 0.1–0.9)
Monocytes: 8 %
Neutrophils Absolute: 2.2 10*3/uL (ref 1.4–7.0)
Neutrophils: 41 %
Platelets: 447 10*3/uL (ref 150–450)
RBC: 5.02 x10E6/uL (ref 3.77–5.28)
RDW: 22.9 % — ABNORMAL HIGH (ref 11.7–15.4)
WBC: 5.2 10*3/uL (ref 3.4–10.8)

## 2023-11-13 LAB — COMPREHENSIVE METABOLIC PANEL
ALT: 8 [IU]/L (ref 0–32)
AST: 17 [IU]/L (ref 0–40)
Albumin: 4.2 g/dL (ref 4.0–5.0)
Alkaline Phosphatase: 74 [IU]/L (ref 44–121)
BUN/Creatinine Ratio: 13 (ref 9–23)
BUN: 7 mg/dL (ref 6–20)
Bilirubin Total: 0.3 mg/dL (ref 0.0–1.2)
CO2: 21 mmol/L (ref 20–29)
Calcium: 9.2 mg/dL (ref 8.7–10.2)
Chloride: 102 mmol/L (ref 96–106)
Creatinine, Ser: 0.52 mg/dL — ABNORMAL LOW (ref 0.57–1.00)
Globulin, Total: 2.9 g/dL (ref 1.5–4.5)
Glucose: 82 mg/dL (ref 70–99)
Potassium: 3.7 mmol/L (ref 3.5–5.2)
Sodium: 139 mmol/L (ref 134–144)
Total Protein: 7.1 g/dL (ref 6.0–8.5)
eGFR: 134 mL/min/{1.73_m2} (ref >59)

## 2023-11-13 LAB — TSH: TSH: 4.37 u[IU]/mL (ref 0.450–4.500)

## 2023-11-13 LAB — IRON,TIBC AND FERRITIN PANEL
Ferritin: 18 ng/mL (ref 15–150)
Iron Saturation: 78 % (ref 15–55)
Iron: 375 ug/dL (ref 27–159)
Total Iron Binding Capacity: 480 ug/dL — ABNORMAL HIGH (ref 250–450)
UIBC: 105 ug/dL — ABNORMAL LOW (ref 131–425)

## 2023-11-15 ENCOUNTER — Encounter: Payer: Self-pay | Admitting: Student

## 2023-11-16 ENCOUNTER — Telehealth: Payer: Self-pay | Admitting: Student

## 2023-11-16 NOTE — Telephone Encounter (Signed)
Called patient and confirmed DOB. Discussed high iron levels. Told her to stop iron pill and we will recheck in 4 weeks. Denies any family hx of liver conditions

## 2023-12-16 ENCOUNTER — Ambulatory Visit (INDEPENDENT_AMBULATORY_CARE_PROVIDER_SITE_OTHER): Payer: Medicaid Other

## 2023-12-16 VITALS — BP 110/70 | HR 72 | Temp 98.4°F | Ht 65.0 in | Wt 114.8 lb

## 2023-12-16 DIAGNOSIS — Z0000 Encounter for general adult medical examination without abnormal findings: Secondary | ICD-10-CM | POA: Diagnosis not present

## 2023-12-16 DIAGNOSIS — H6123 Impacted cerumen, bilateral: Secondary | ICD-10-CM | POA: Diagnosis not present

## 2023-12-16 DIAGNOSIS — Z23 Encounter for immunization: Secondary | ICD-10-CM

## 2023-12-16 NOTE — Progress Notes (Signed)
  SUBJECTIVE:   CHIEF COMPLAINT / HPI:   Hard of hearing: On Sunday, patient was cleaning her ears with Q-tips and feels that for both ears she went too far and pushed the earwax further.  She has been poorly able to hear out of both ears since then.  Denies fever or significant pain or any infectious symptoms.  PERTINENT  PMH / PSH: Eczema, acne  OBJECTIVE:  BP 110/70   Pulse 72   Temp 98.4 F (36.9 C)   Ht 5\' 5"  (1.651 m)   Wt 114 lb 12.8 oz (52.1 kg)   SpO2 95%   BMI 19.10 kg/m  General: Well-appearing, NAD HEENT: Significant cerumen impaction in both ears, unable to evaluate tympanic membrane bilaterally, normal auditory canals  ASSESSMENT/PLAN:   Assessment & Plan Bilateral hearing loss due to cerumen impaction Flushed out ears, cerumen impaction resolved.  No need for Debrox.  She did have benefit immediately after flushing.  Advised not to use Q-tips for cleaning ears. Healthcare maintenance Provided flu vaccine.  Advised to return at a separate appointment for Pap smear.  Return if symptoms worsen or fail to improve. Shelby Mattocks, DO 12/16/2023, 9:02 AM PGY-3, [facility]

## 2023-12-16 NOTE — Assessment & Plan Note (Signed)
 Provided flu vaccine.  Advised to return at a separate appointment for Pap smear.

## 2023-12-16 NOTE — Patient Instructions (Addendum)
 It was great to see you today! Thank you for choosing [facility] for your primary care.  Today we addressed: You had earwax in both ears that was likely affecting your hearing.  Both ears were cleaned out and you do not need any further medication for that.  Please refrain from using Q-tips in the ears for this very reason. It is recommended that at some point you obtain a Pap smear.  Please return for separate visit for this. Gave you a flu vaccine today  If you haven't already, sign up for My Chart to have easy access to your labs results, and communication with your primary care physician.  Return if symptoms worsen or fail to improve. Please arrive 15 minutes before your appointment to ensure smooth check in process.  We appreciate your efforts in making this happen.  Thank you for allowing me to participate in your care, Shelby Mattocks, DO 12/16/2023, 9:02 AM PGY-3, [facility]

## 2023-12-31 ENCOUNTER — Other Ambulatory Visit: Payer: Self-pay

## 2023-12-31 DIAGNOSIS — L700 Acne vulgaris: Secondary | ICD-10-CM

## 2023-12-31 DIAGNOSIS — R21 Rash and other nonspecific skin eruption: Secondary | ICD-10-CM

## 2023-12-31 MED ORDER — CETIRIZINE HCL 10 MG PO TABS
10.0000 mg | ORAL_TABLET | Freq: Every day | ORAL | 6 refills | Status: AC
Start: 2023-12-31 — End: ?

## 2023-12-31 MED ORDER — RETIN-A 0.025 % EX CREA: TOPICAL_CREAM | CUTANEOUS | 3 refills | Status: AC

## 2024-01-17 NOTE — Telephone Encounter (Signed)
 Marland Kitchen

## 2024-03-02 ENCOUNTER — Other Ambulatory Visit: Payer: Self-pay

## 2024-03-02 DIAGNOSIS — L2082 Flexural eczema: Secondary | ICD-10-CM

## 2024-03-06 ENCOUNTER — Other Ambulatory Visit: Payer: Self-pay | Admitting: Family Medicine

## 2024-03-06 DIAGNOSIS — L2082 Flexural eczema: Secondary | ICD-10-CM

## 2024-06-26 DIAGNOSIS — Z111 Encounter for screening for respiratory tuberculosis: Secondary | ICD-10-CM | POA: Diagnosis not present
